# Patient Record
Sex: Female | Born: 1980 | Hispanic: Yes | Marital: Married | Smoking: Never smoker
Health system: Southern US, Community
[De-identification: ages and names within clinical notes are randomized; demographics above are authoritative.]

---

## 2008-06-18 ENCOUNTER — Ambulatory Visit: Payer: Self-pay | Admitting: Certified Nurse Midwife

## 2010-12-25 ENCOUNTER — Emergency Department: Payer: Self-pay | Admitting: *Deleted

## 2011-01-04 ENCOUNTER — Emergency Department: Payer: Self-pay | Admitting: Emergency Medicine

## 2011-12-05 ENCOUNTER — Ambulatory Visit: Payer: Self-pay

## 2013-08-04 IMAGING — US ULTRASOUND RIGHT BREAST
1 series · 13 of 13 positions shown · non-contrast
Comparison: none

REASON FOR EXAM: targeted pain with slight thickening at [DATE] right breast
COMMENTS:

PROCEDURE:     US  - US RT BREAST ([REDACTED])  - December 05, 2011  [DATE]
RESULT:
Targeted Ultrasound in the right breast area of concern in the 7 o'clock
region, 2 cm from the nipple, shows no focal sonographic abnormality.

[Series 1: ultrasound right breast · 0.08mm/px · 13 of 13 slices shown]
[im 1/13]
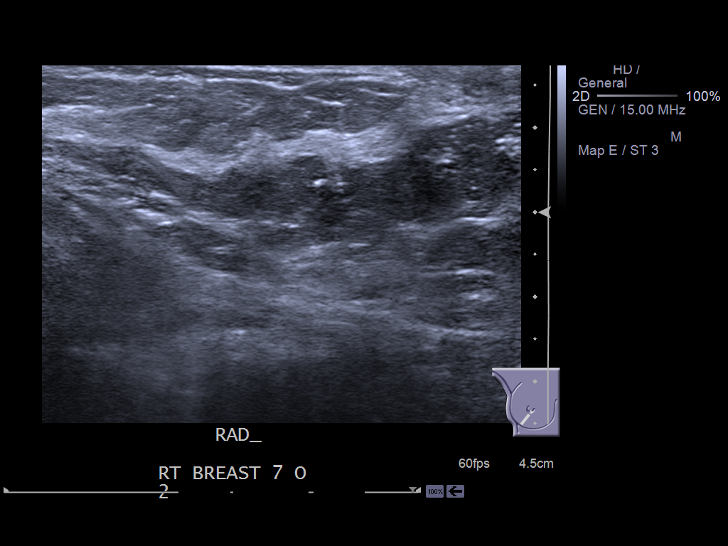
[im 2/13]
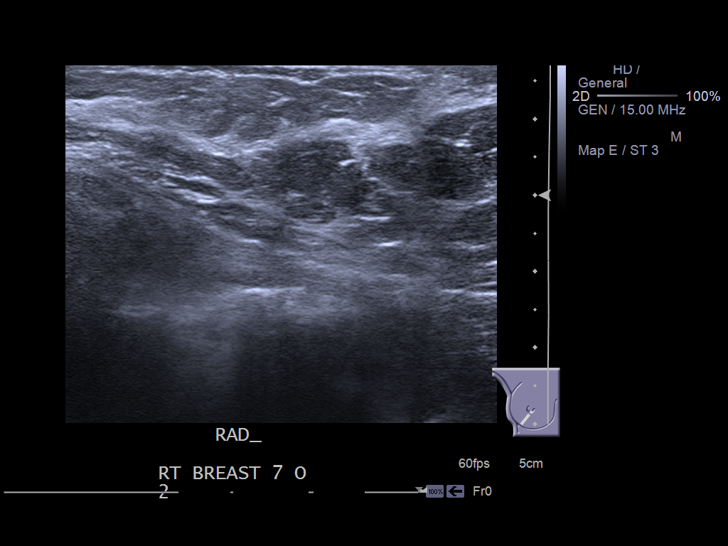
[im 3/13]
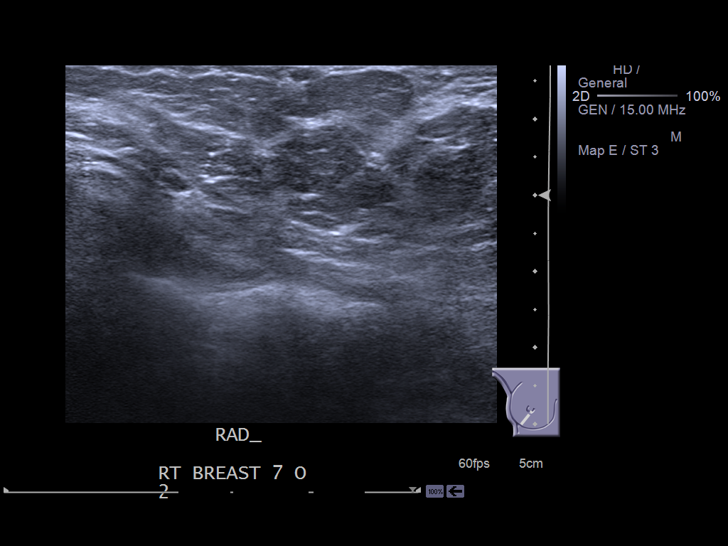
[im 4/13]
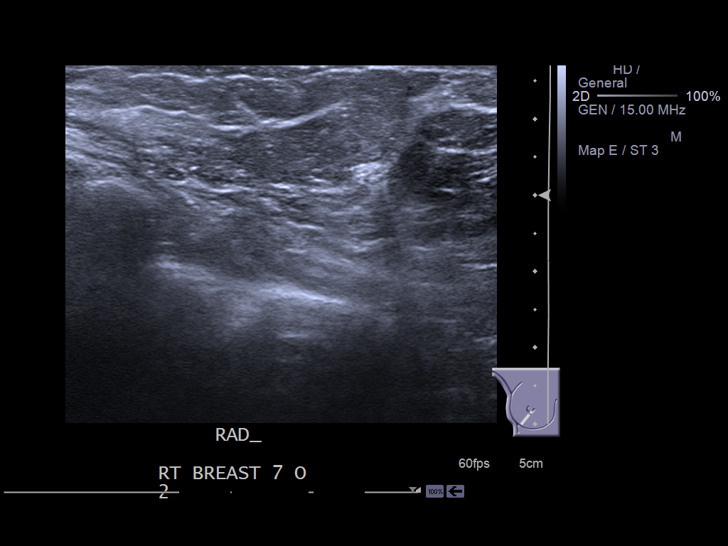
[im 5/13]
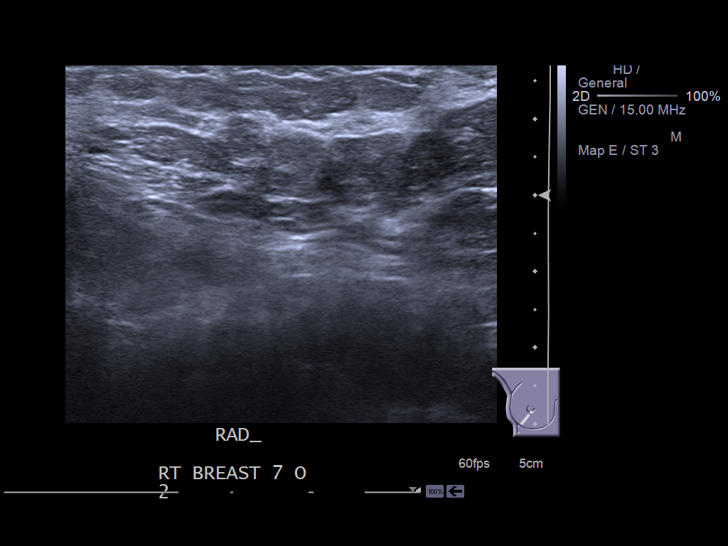
[im 6/13]
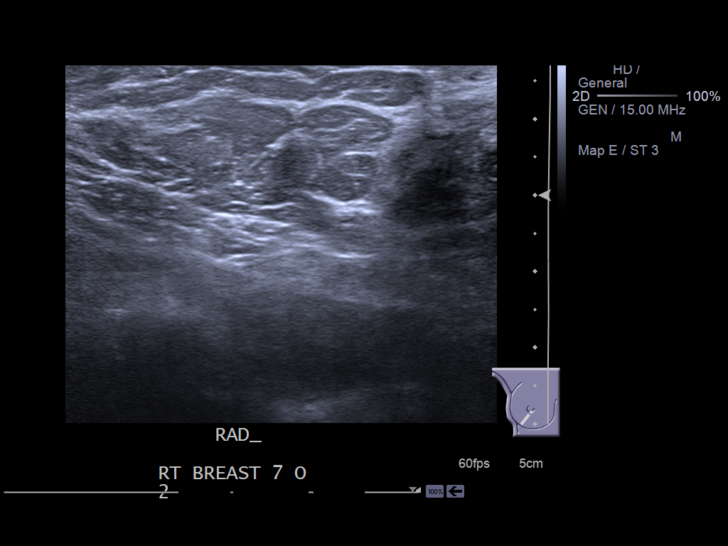
[im 7/13]
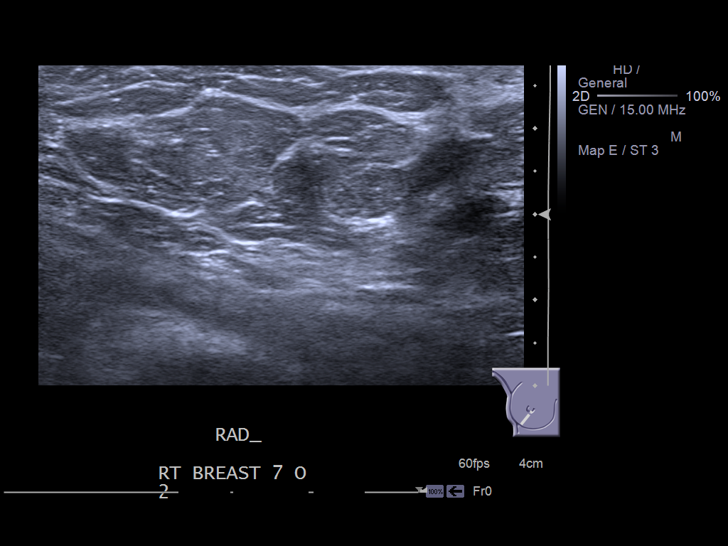
[im 8/13]
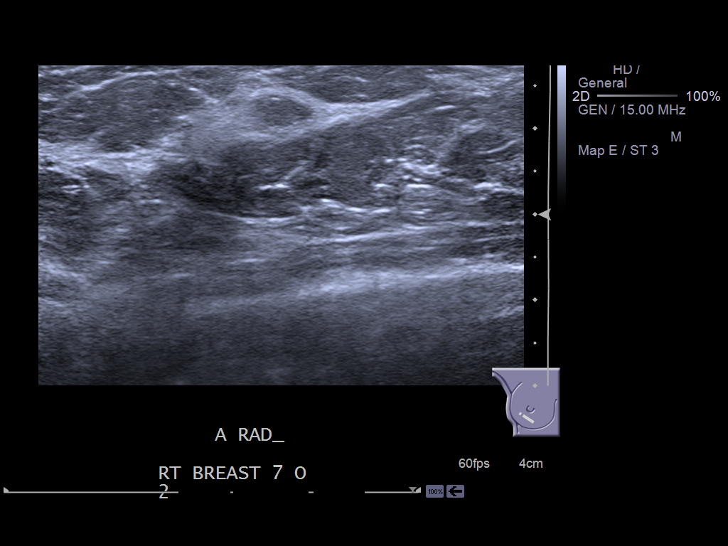
[im 9/13]
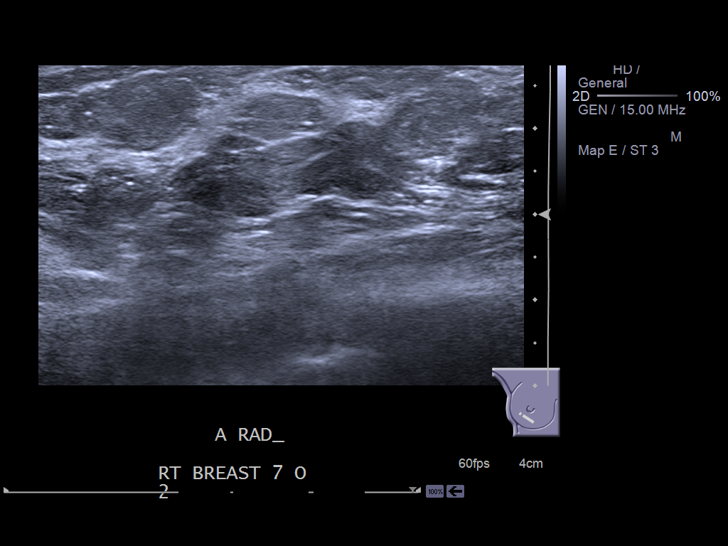
[im 10/13]
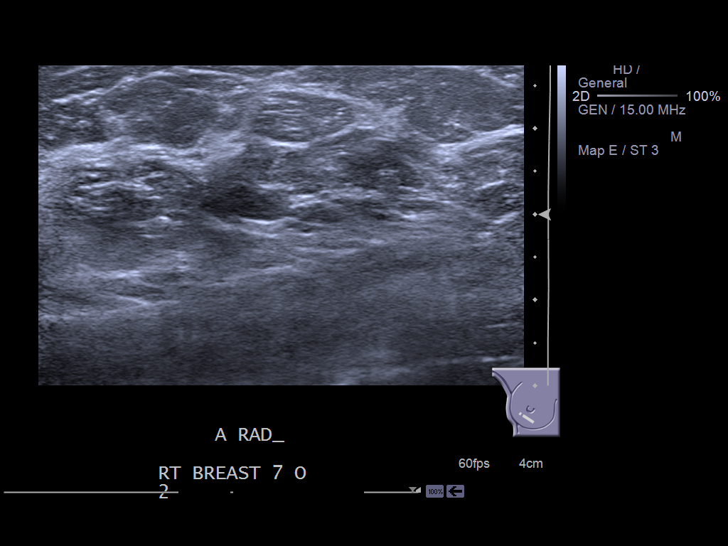
[im 11/13]
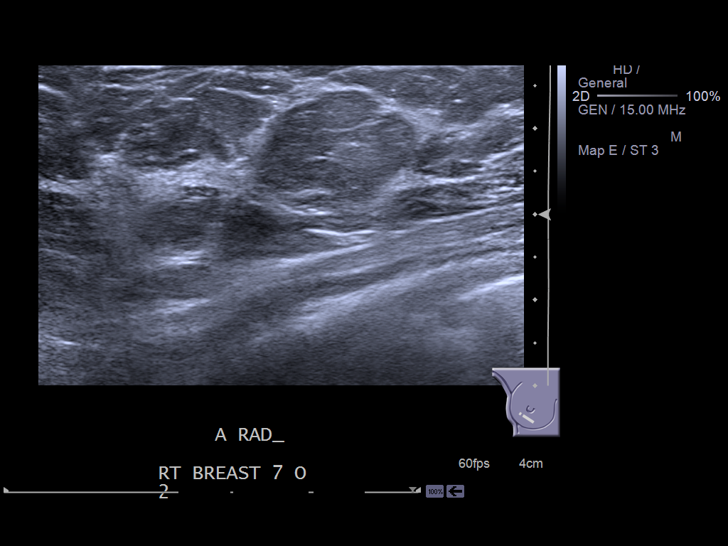
[im 12/13]
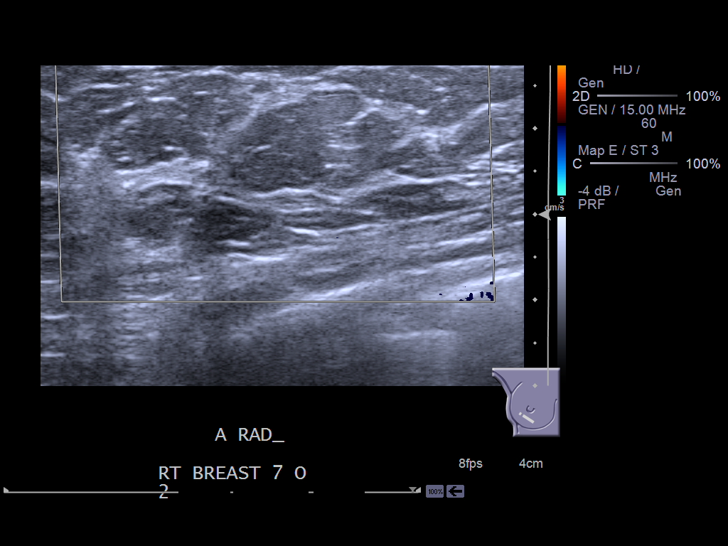
[im 13/13]
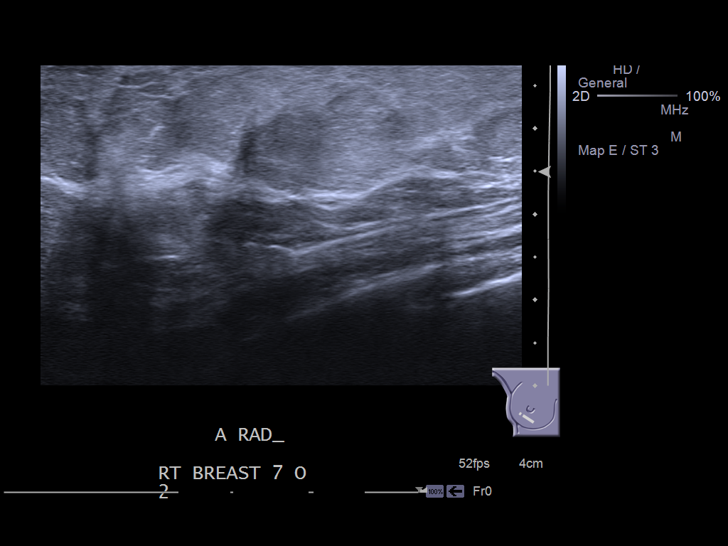

[13 of 13 positions shown; findings below may reference images not displayed]

IMPRESSION: Negative targeted Right Breast Ultrasound.

[REDACTED]

## 2016-07-05 ENCOUNTER — Ambulatory Visit
Admission: RE | Admit: 2016-07-05 | Discharge: 2016-07-05 | Disposition: A | Discharge status: Home / Self Care | Payer: Self-pay | Source: Ambulatory Visit | Attending: Oncology | Admitting: Oncology

## 2016-07-05 ENCOUNTER — Ambulatory Visit
Admission: RE | Admit: 2016-07-05 | Discharge: 2016-07-05 | Disposition: A | Discharge status: Home / Self Care | Payer: Self-pay | Source: Ambulatory Visit

## 2016-07-05 ENCOUNTER — Ambulatory Visit: Payer: Self-pay | Attending: Oncology

## 2016-07-05 VITALS — BP 124/70 | HR 93 | Temp 96.3°F | Ht 61.42 in | Wt 170.3 lb

## 2016-07-05 DIAGNOSIS — N644 Mastodynia: Secondary | ICD-10-CM

## 2016-07-05 DIAGNOSIS — N63 Unspecified lump in unspecified breast: Secondary | ICD-10-CM

## 2016-07-05 NOTE — Progress Notes (Addendum)
Subjective:     Patient ID: Rigoberto Noelaola Vazquez Torres, female   DOB: 11/24/1980, 36 y.o.   MRN: [id number]  HPI   Review of Systems     Objective:   Physical Exam  Pulmonary/Chest: Right breast exhibits no inverted nipple, no mass, no nipple discharge, no skin change and no tenderness. Left breast exhibits mass and tenderness. Left breast exhibits no inverted nipple, no nipple discharge and no skin change. Breasts are symmetrical.    Generalized left breast pain,retroareolar itching, and nodule       Assessment:     36 year old hispanic patient  presents for [facility] clinic visit with complaint of left breast pain, and retroareolar itching and tenderness.   Patient screened, and meets [facility] eligibility.  Patient does not have insurance, Medicare or Medicaid.  Handout given on Affordable Care Act . Instructed patient on breast self-exam using teach back method.  Patient reports left retroareolar tenderness on palpation, and generalized left breast pain. Palpated retroareolar nodularity at 1 o'clock, and 6 o'clock.   States pain has been continuous x 3 months. Tried Tylenol for pain with no relief.  Maritza Afandor interpreted exam.  Patient also complaining of right neck pain starting today. Rates 8 on 0-10 scale.  Recommended follow-up with Primary physician or Emergency Department for this neck pain.    Plan:     Sent for bilateral diagnostic mammogram and ultrasound.

## 2016-07-07 NOTE — Progress Notes (Signed)
Patient scheduled with Dr. Lemar LivingsByrnett on 06/27/16 for consult, and possible punch biopsy per radiologist recommendation.

## 2016-07-17 ENCOUNTER — Encounter: Payer: Self-pay | Admitting: General Surgery

## 2016-07-17 ENCOUNTER — Ambulatory Visit (INDEPENDENT_AMBULATORY_CARE_PROVIDER_SITE_OTHER): Payer: PRIVATE HEALTH INSURANCE | Admitting: General Surgery

## 2016-07-17 VITALS — BP 120/80 | HR 76 | Resp 12 | Ht 61.0 in | Wt 168.0 lb

## 2016-07-17 DIAGNOSIS — N644 Mastodynia: Secondary | ICD-10-CM | POA: Diagnosis not present

## 2016-07-17 NOTE — Patient Instructions (Addendum)
Take 800 mg Ibuprofen three times a day for the next 10 days. You may take Tylenol also if needed. Follow up in two weeks.

## 2016-07-17 NOTE — Progress Notes (Signed)
Patient ID: Anne Wallace, female   DOB: [birth year], 36 y.o.   MRN: [id number]  Chief Complaint  Patient presents with  . Other    breast pain     HPI Anne Wallace is a 36 y.o. female here for evaluation of left breast pain at the nipple and itching. She reports no nipple discharge. She states this has been ongoing for 3 months. She reports that the pain has increased in frequency and is throbbing. She was seen by her primary care doctor for this referred for a mammogram and subsequently an ultrasound.  The discomfort is described as a throbbing sensitization, worse with direct pressure. The patient denies any discoloration or redness of the nipple/areola, nor any discharge/drainage.  On occasion she has made use of 800 mg ibuprofen with some improvement.  The patient had an episode of right breast pain several years ago that lasted only a few weeks. It was different in character from that she is presently experiencing.  No history of trauma. The patient denies any nipple/oral contact.  The left breast was the preferential side for her children while nursing.  She did report that she had bleeding from her nipples while nursing but she did continue to nurse. No report of mastitis. Her youngest child is now age 38.  She had a mammogram and ultrasound of the left breast on 07/05/16. Surgical consultation was recommended. She does report she had an episode of right breast pain in 2013 that lasted 2-3 weeks.  She is here today with [name] from [facility].   HPI  No past medical history on file.  No past surgical history on file.  Family History  Problem Relation Age of Onset  . Cervical cancer Maternal Grandmother   . Breast cancer Neg Hx     Social History Social History  Substance Use Topics  . Smoking status: Never Smoker  . Smokeless tobacco: Never Used  . Alcohol use No    No Known Allergies  Current Outpatient Prescriptions  Medication Sig Dispense  Refill  . levonorgestrel (MIRENA) 20 MCG/24HR IUD 1 each by Intrauterine route once.     No current facility-administered medications for this visit.     Review of Systems Review of Systems  Constitutional: Negative.   Respiratory: Negative.   Cardiovascular: Negative.     Blood pressure 120/80, pulse 76, resp. rate 12, height 5\' 1"  (1.549 m), weight 168 lb (76.2 kg).  Physical Exam Physical Exam  Constitutional: She is oriented to person, place, and time. She appears well-developed and well-nourished.  Eyes: Conjunctivae are normal. No scleral icterus.  Neck: Neck supple.  Cardiovascular: Normal rate, regular rhythm and normal heart sounds.   Pulmonary/Chest: Effort normal and breath sounds normal. Right breast exhibits no inverted nipple, no mass, no nipple discharge, no skin change and no tenderness. Left breast exhibits tenderness. Left breast exhibits no inverted nipple, no mass, no nipple discharge and no skin change.  Lymphadenopathy:    She has no cervical adenopathy.  Neurological: She is alert and oriented to person, place, and time.  Skin: Skin is warm and dry.  Psychiatric: She has a normal mood and affect.     Data Reviewed The mammogram and ultrasound of 07/05/2016 was reviewed. The report describes erythema being evident.  Neither the mammogram nor ultrasound showed any radiologic abnormality. BI-RADS-1. Nurse navigator exam for 07/05/2016 describes a nodular area in the retroareolar tissue. This is not evident on today's exam.  Assessment  Unilateral mastalgia without clear etiology.    Plan    The patient has been asked to make use of ibuprofen 800 mg 3 times a day for 10 days. A phone report would've been helpful, but due to the language barrier we'll have her return in 2 weeks for an examination when a interpreter is present. (Patient preference).    This has been scribed by Sinda Duaryl-Lyn M Kennedy LPN    Earline MayotteByrnett, Jeffrey W 07/17/2016, 3:20 PM

## 2016-07-31 ENCOUNTER — Ambulatory Visit (INDEPENDENT_AMBULATORY_CARE_PROVIDER_SITE_OTHER): Payer: PRIVATE HEALTH INSURANCE | Admitting: General Surgery

## 2016-07-31 ENCOUNTER — Encounter: Payer: Self-pay | Admitting: General Surgery

## 2016-07-31 VITALS — BP 120/76 | HR 71 | Resp 14 | Ht 61.0 in | Wt 168.0 lb

## 2016-07-31 DIAGNOSIS — N644 Mastodynia: Secondary | ICD-10-CM | POA: Diagnosis not present

## 2016-07-31 NOTE — Progress Notes (Signed)
Patient ID: Anne Wallace, female   DOB: 1980-06-20, 36 y.o.   MRN: [id number]  Chief Complaint  Patient presents with  . Other    breast pain    HPI Anne Wallace is a 36 y.o. female she reports significant relief with the use of the Ibuprofen. She has been taking 1-2 800 mg tablets a day. She is concerned about taking too much medication. [name] with interpreter services is here with her today.  HPI  No past medical history on file.  Past Surgical History:  Procedure Laterality Date  . CESAREAN SECTION     x 2    Family History  Problem Relation Age of Onset  . Cervical cancer Maternal Grandmother   . Breast cancer Neg Hx     Social History Social History  Substance Use Topics  . Smoking status: Never Smoker  . Smokeless tobacco: Never Used  . Alcohol use No    No Known Allergies  Current Outpatient Prescriptions  Medication Sig Dispense Refill  . levonorgestrel (MIRENA) 20 MCG/24HR IUD 1 each by Intrauterine route once.     No current facility-administered medications for this visit.     Review of Systems Review of Systems  Constitutional: Negative.   Respiratory: Negative.   Cardiovascular: Negative.     Blood pressure 120/76, pulse 71, resp. rate 14, height  (1.549 m), weight 168 lb (76.2 kg).  Physical Exam Physical Exam  Constitutional: She is oriented to person, place, and time. She appears well-developed and well-nourished.  Pulmonary/Chest: Left breast exhibits no inverted nipple, no mass, no nipple discharge, no skin change and no tenderness.    Neurological: She is alert and oriented to person, place, and time.  Skin: Skin is warm and dry.  Psychiatric: She has a normal mood and affect.    Assessment    Marked resolution of left breast mastalgia with anti-inflammatory treatment.     Plan    A relatively low dose of medication the patient made use of to achieve relief was reviewed, and its safety to use in the future.  She is nearly asymptomatic at this time and may discontinue the medication at any time. She is advised that she may have recurrent flares in the future, and she arrange for follow-up examination here if needed.    HPI, Physical Exam, Assessment and Plan have been scribed under the direction and in the presence of Earline Mayotte, MD  Carron Brazen, LPN  I have completed the exam and reviewed the above documentation for accuracy and completeness.  I agree with the above.  Museum/gallery conservator has been used and any errors in dictation or transcription are unintentional.  Donnalee Curry, M.D., F.A.C.S. Earline Mayotte 08/01/2016, 7:19 AM

## 2016-07-31 NOTE — Patient Instructions (Addendum)
Follow up as needed.  May continue use of Ibuprofen as needed, and gradually use less.

## 2016-10-02 NOTE — Progress Notes (Unsigned)
Patient followed up with Dr. Lemar LivingsByrnett , and took ibuprofen for mastitis at his direction, with resolution of breast pain, erythema.  To return to clinic if needed. Copy to [facility].

## 2018-03-05 IMAGING — US US BREAST*L* LIMITED INC AXILLA
1 series · 5 of 5 positions shown · non-contrast
Comparison: Previous exam(s).

CLINICAL DATA: 36-year-old female complaining of left nipple pain
and erythema as well as diffuse left breast pain.

EXAM:
2D DIGITAL DIAGNOSTIC BILATERAL MAMMOGRAM WITH CAD AND ADJUNCT TOMO
ULTRASOUND LEFT BREAST

[Series 1: us breast*left* limited inc axilla · 0.07mm/px · 5 of 5 slices shown]
[im 1/5]
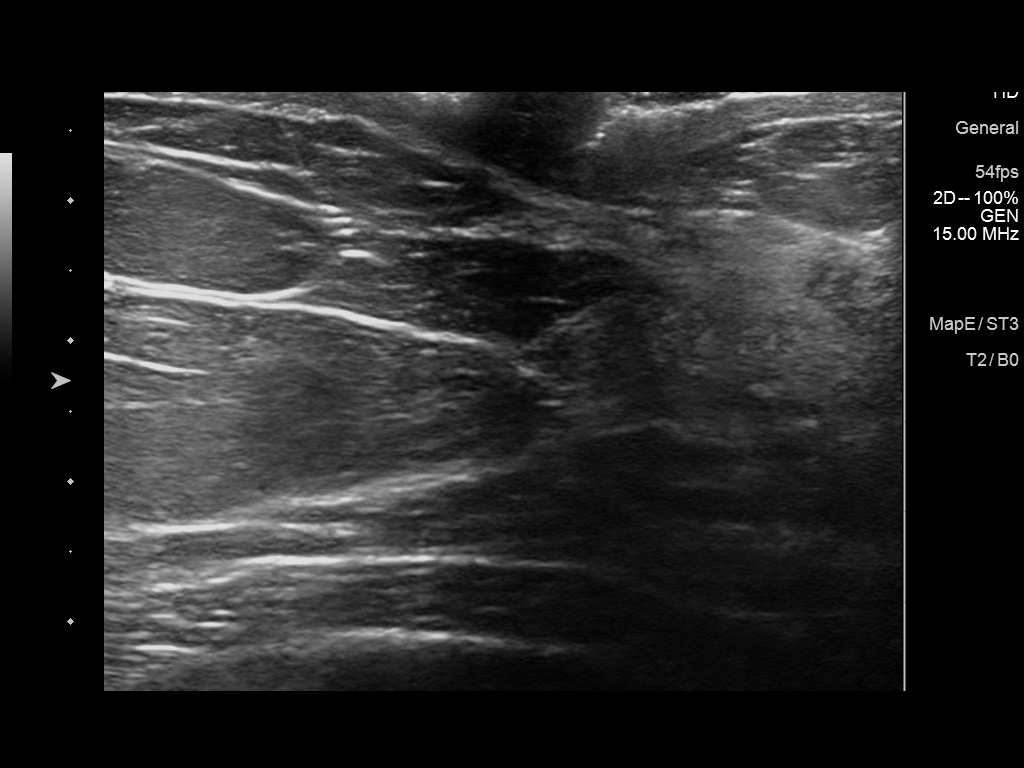
[im 2/5]
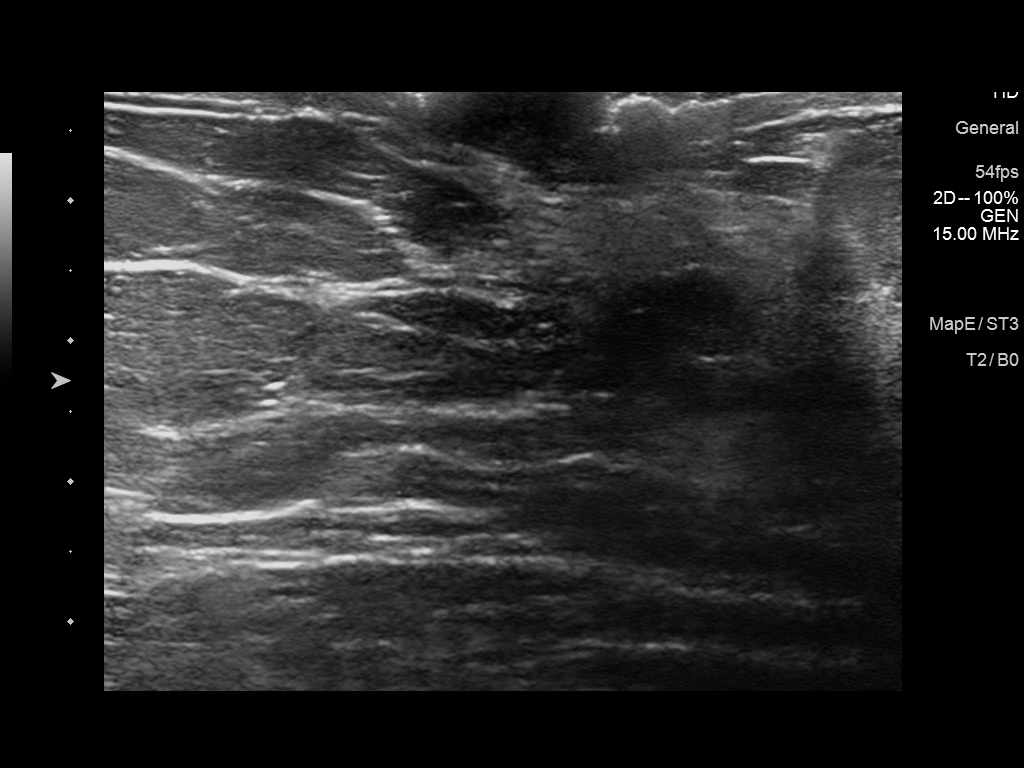
[im 3/5]
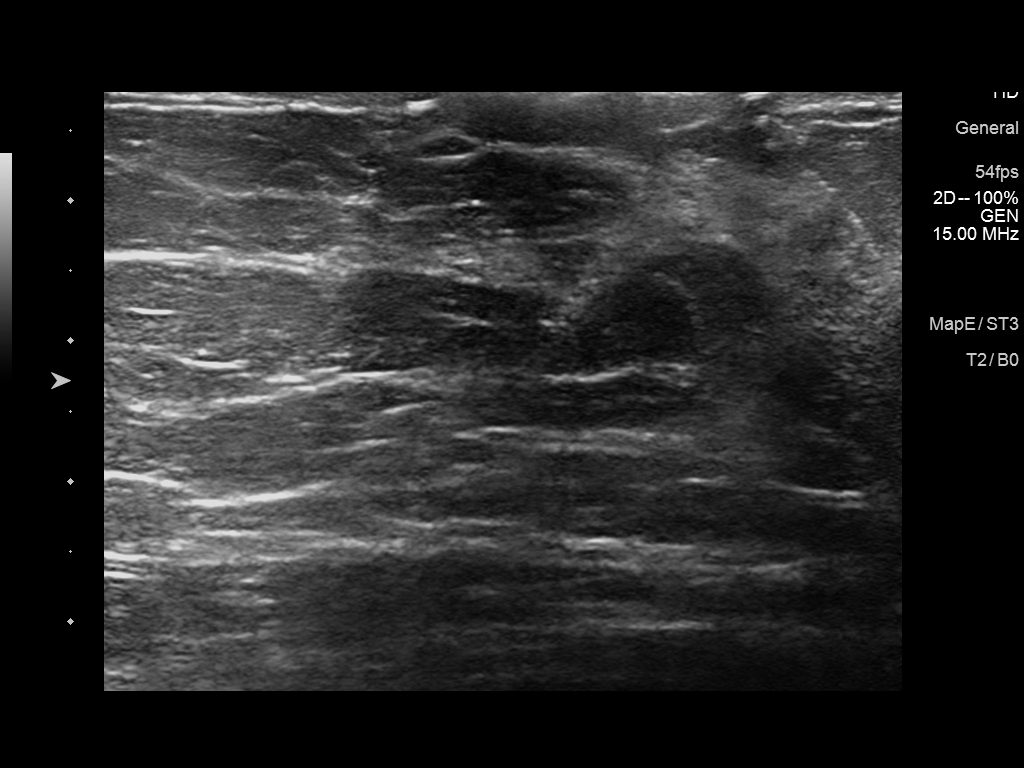
[im 4/5]
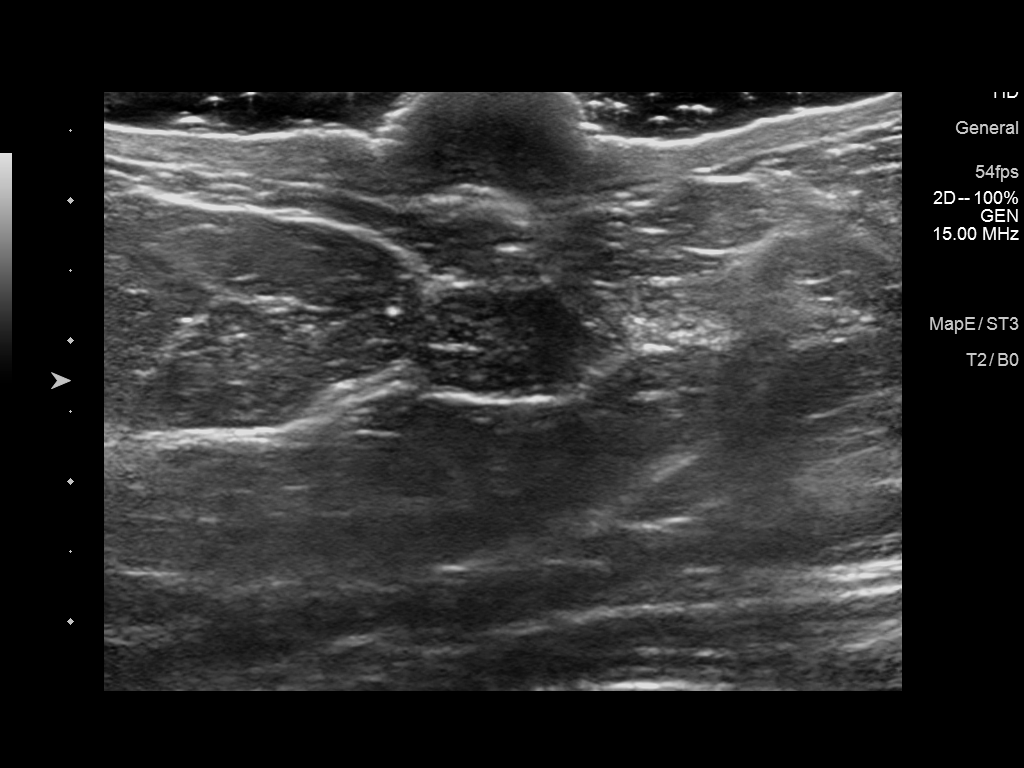
[im 5/5]
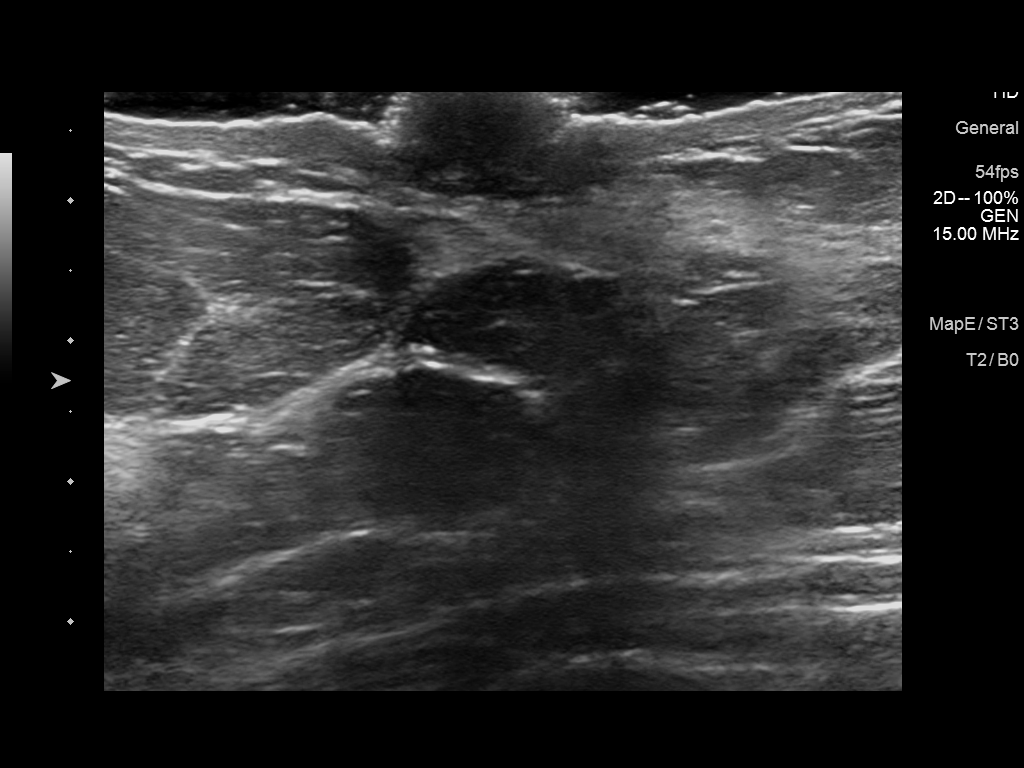

[5 of 5 positions shown; findings below may reference images not displayed]

ACR Breast Density Category b: There are scattered areas of
fibroglandular density.
FINDINGS: No suspicious mass, malignant type microcalcifications or distortion
detected in either breast.

Mammographic images were processed with CAD.

On physical exam, there is erythema of the left nipple with no
ulceration. Patient states that is associated with pain. No mass is
palpated in the left breast.

Targeted ultrasound is performed, showing normal tissue in the
subareolar region of the left breast. No solid or cystic mass,
abnormal shadowing or distortion visualized.
IMPRESSION: No imaging findings of malignancy in either breast.

RECOMMENDATION:
The left nipple is erythematous and there may be secondary to
inflammation or Paget's disease. Surgical or dermatological
consultation is recommended. Punch biopsy may be warranted for
diagnosis.

I have discussed the findings and recommendations with the patient.
Results were also provided in writing at the conclusion of the
visit. If applicable, a reminder letter will be sent to the patient
regarding the next appointment.

BI-RADS CATEGORY  1: Negative.

## 2018-11-25 ENCOUNTER — Other Ambulatory Visit: Payer: Self-pay

## 2018-11-25 DIAGNOSIS — Z20822 Contact with and (suspected) exposure to covid-19: Secondary | ICD-10-CM

## 2018-11-26 LAB — NOVEL CORONAVIRUS, NAA: SARS-CoV-2, NAA: DETECTED — AB

## 2024-01-03 ENCOUNTER — Telehealth: Payer: Self-pay | Admitting: *Deleted

## 2024-01-03 ENCOUNTER — Encounter: Payer: Self-pay | Admitting: Primary Care
